# Patient Record
Sex: Female | Born: 1965 | Race: White | Hispanic: No | Marital: Single | Smoking: Current every day smoker
Health system: Southern US, Community
[De-identification: ages and names within clinical notes are randomized; demographics above are authoritative.]

## PROBLEM LIST (undated history)

## (undated) HISTORY — PX: ABDOMINAL HYSTERECTOMY: SHX81

---

## 2017-12-20 ENCOUNTER — Other Ambulatory Visit: Payer: Self-pay

## 2017-12-20 ENCOUNTER — Encounter (HOSPITAL_BASED_OUTPATIENT_CLINIC_OR_DEPARTMENT_OTHER): Payer: Self-pay | Admitting: *Deleted

## 2017-12-20 ENCOUNTER — Emergency Department (HOSPITAL_BASED_OUTPATIENT_CLINIC_OR_DEPARTMENT_OTHER)
Admission: EM | Admit: 2017-12-20 | Discharge: 2017-12-20 | Disposition: A | Discharge status: Home / Self Care | Payer: PRIVATE HEALTH INSURANCE | Attending: Emergency Medicine | Admitting: Emergency Medicine

## 2017-12-20 ENCOUNTER — Emergency Department (HOSPITAL_BASED_OUTPATIENT_CLINIC_OR_DEPARTMENT_OTHER): Payer: PRIVATE HEALTH INSURANCE

## 2017-12-20 DIAGNOSIS — Y929 Unspecified place or not applicable: Secondary | ICD-10-CM | POA: Insufficient documentation

## 2017-12-20 DIAGNOSIS — Y998 Other external cause status: Secondary | ICD-10-CM | POA: Insufficient documentation

## 2017-12-20 DIAGNOSIS — S8001XA Contusion of right knee, initial encounter: Secondary | ICD-10-CM | POA: Diagnosis not present

## 2017-12-20 DIAGNOSIS — F17200 Nicotine dependence, unspecified, uncomplicated: Secondary | ICD-10-CM | POA: Diagnosis not present

## 2017-12-20 DIAGNOSIS — W010XXA Fall on same level from slipping, tripping and stumbling without subsequent striking against object, initial encounter: Secondary | ICD-10-CM | POA: Diagnosis not present

## 2017-12-20 DIAGNOSIS — Y9301 Activity, walking, marching and hiking: Secondary | ICD-10-CM | POA: Insufficient documentation

## 2017-12-20 DIAGNOSIS — W19XXXA Unspecified fall, initial encounter: Secondary | ICD-10-CM

## 2017-12-20 DIAGNOSIS — R6884 Jaw pain: Secondary | ICD-10-CM | POA: Diagnosis not present

## 2017-12-20 DIAGNOSIS — S8991XA Unspecified injury of right lower leg, initial encounter: Secondary | ICD-10-CM | POA: Diagnosis present

## 2017-12-20 NOTE — ED Provider Notes (Signed)
[facility] EMERGENCY DEPARTMENT Provider Note   CSN: [id number] Arrival date & time: 12/20/17  1712     History   Chief Complaint Chief Complaint  Patient presents with  . Fall    HPI Samantha Hull is a 52 y.o. female.  She is who presents the emergency department for evaluation of knee pain.  Patient states that she was in [place] for work today and she came down off of a step and fell directly onto her right knee and hit her right jaw.  She denies loss of consciousness, difficulty with bites.  She complains of pain and difficulty ambulating on the right is especially with trying to extend the knee fully.  She has been able to ambulate however.  She was able to drive home from [place].  She denies numbness, tingling, calf pain, chest pain shortness of breath.  HPI  History reviewed. No pertinent past medical history.  There are no active problems to display for this patient.   Past Surgical History:  Procedure Laterality Date  . ABDOMINAL HYSTERECTOMY       OB History   None      Home Medications    Prior to Admission medications   Not on File    Family History History reviewed. No pertinent family history.  Social History Social History   Tobacco Use  . Smoking status: Current Every Day Smoker  . Smokeless tobacco: Never Used  Substance Use Topics  . Alcohol use: Yes    Frequency: Never  . Drug use: Never     Allergies   Patient has no known allergies.   Review of Systems Review of Systems Ten systems reviewed and are negative for acute change, except as noted in the HPI.    Physical Exam Updated Vital Signs BP (!) 143/87 (BP Location: Left Arm)   Pulse 94   Temp 98.7 F (37.1 C) (Oral)   Resp 18   Ht 5\' 6"  (1.676 m)   Wt 58.5 kg   SpO2 100%   BMI 20.82 kg/m   Physical Exam  Constitutional: She is oriented to person, place, and time. She appears well-developed and well-nourished. No distress.  HENT:  Head:  Normocephalic.  Small bruise on the left lower mandible.  Strong bite, teeth intact.  Eyes: Conjunctivae are normal. No scleral icterus.  Neck: Normal range of motion.  Cardiovascular: Normal rate, regular rhythm and normal heart sounds. Exam reveals no gallop and no friction rub.  No murmur heard. Pulmonary/Chest: Effort normal and breath sounds normal. No respiratory distress.  Abdominal: Soft. Bowel sounds are normal. She exhibits no distension and no mass. There is no tenderness. There is no guarding.  Musculoskeletal:  Right knee with obvious hematoma over the patella.  Tender to palpation.  Mild abrasion.  Patient has a pain with hyperflexion and full extension of the knee.  She has normal active range of motion.  Ligaments appear stable.  Contralateral knee exam is normal.  Ipsilateral ankle and hip exam are normal.  Neurological: She is alert and oriented to person, place, and time.  Skin: Skin is warm and dry. She is not diaphoretic.  Psychiatric: Her behavior is normal.  Nursing note and vitals reviewed.    ED Treatments / Results  Labs (all labs ordered are listed, but only abnormal results are displayed) Labs Reviewed - No data to display  EKG None  Radiology Dg Knee Complete 4 Views Left  Result Date: 12/20/2017 CLINICAL DATA:  Status post fall today with injury to both knees. EXAM: LEFT KNEE - COMPLETE 4+ VIEW COMPARISON:  None. FINDINGS: No evidence of fracture, dislocation, or joint effusion. Minimal narrowing of the medial femoral tibial space is noted. Soft tissues are unremarkable. IMPRESSION: No acute fracture or dislocation. Electronically Signed   By: Sherian Rein M.D.   On: 12/20/2017 18:25   Dg Knee Complete 4 Views Right  Result Date: 12/20/2017 CLINICAL DATA:  Status post fall today with injury to both knees. EXAM: RIGHT KNEE - COMPLETE 4+ VIEW COMPARISON:  None. FINDINGS: No evidence of fracture, dislocation, or joint effusion. No evidence of arthropathy or  other focal bone abnormality. Soft tissues are unremarkable. IMPRESSION: Negative. Electronically Signed   By: Sherian Rein M.D.   On: 12/20/2017 18:24    Procedures Procedures (including critical care time)  Medications Ordered in ED Medications - No data to display   Initial Impression / Assessment and Plan / ED Course  I have reviewed the triage vital signs and the nursing notes.  Pertinent labs & imaging results that were available during my care of the patient were reviewed by me and considered in my medical decision making (see chart for details).     Patient with fall, traumatic hematoma of the left knee.  I reviewed the patient's x-rays of both knees and see no significant abnormalities, fractures, effusions or obvious dislocations.  They agree with the radiologic interpretation.  Patient will be given crutches ice and anti-inflammatories.  I do not feel that a knee sleeve would be helpful due to the pressure over the hematoma patient is ambulatory.  She appears appropriate for discharge with supportive care    Final Clinical Impressions(s) / ED Diagnoses   Final diagnoses:  None    ED Discharge Orders    None       Arthor Captain, PA-C 12/20/17 2034    Maia Plan, MD 12/21/17 1141

## 2017-12-20 NOTE — Discharge Instructions (Addendum)
Contact a health care provider if: Your symptoms do not improve after several days of treatment. Your symptoms get worse. You have difficulty moving the injured area. Get help right away if: You have severe pain. You have numbness in a hand or foot. Your hand or foot turns pale or cold. 

## 2017-12-20 NOTE — ED Triage Notes (Signed)
While in [place] for work today she was walking to her car, got weak, her legs gave out and she fell. Injury to both knees. She was able to drive back to [place] afterward.

## 2019-08-27 IMAGING — CR DG KNEE COMPLETE 4+V*L*
4 series · 4 of 4 positions shown · non-contrast
Comparison: None.

CLINICAL DATA: Status post fall today with injury to both knees.

EXAM:
LEFT KNEE - COMPLETE 4+ VIEW

[t knee ap left]
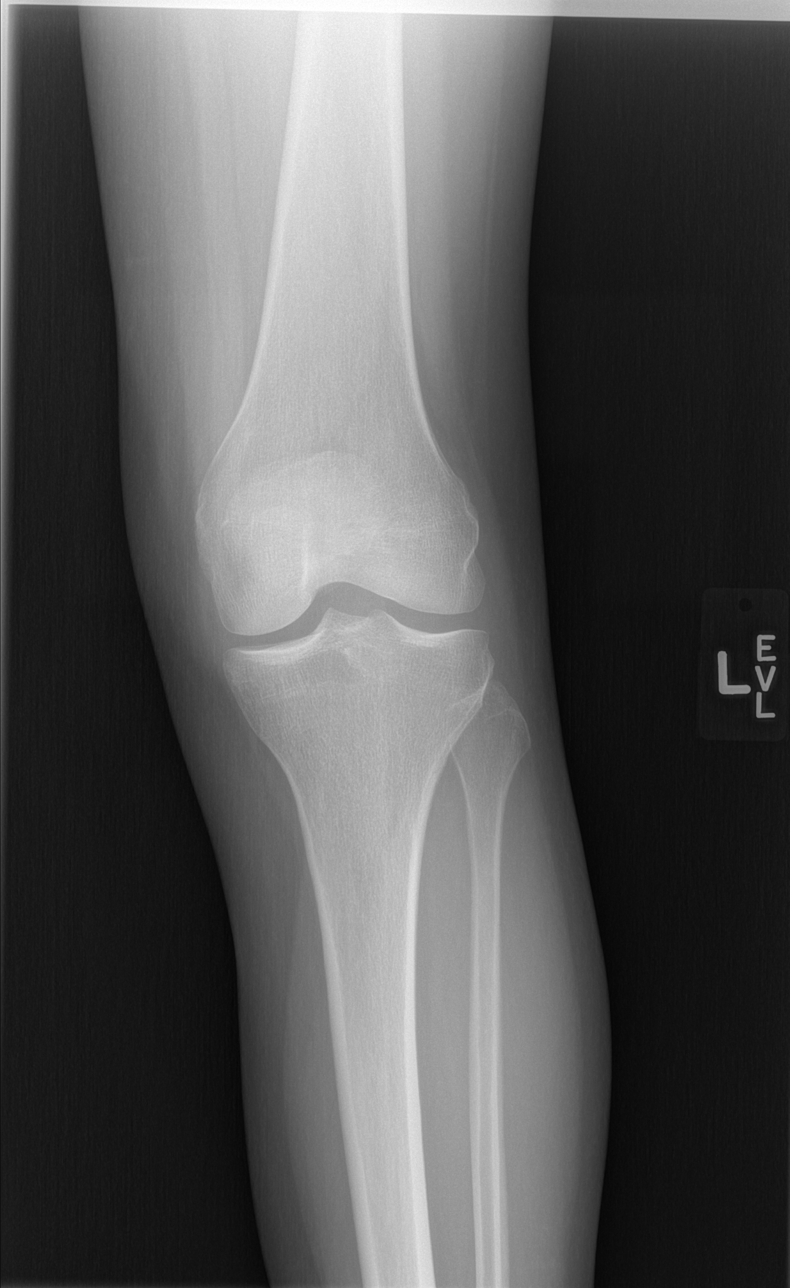

[t knee oblique left (1 of 2)]
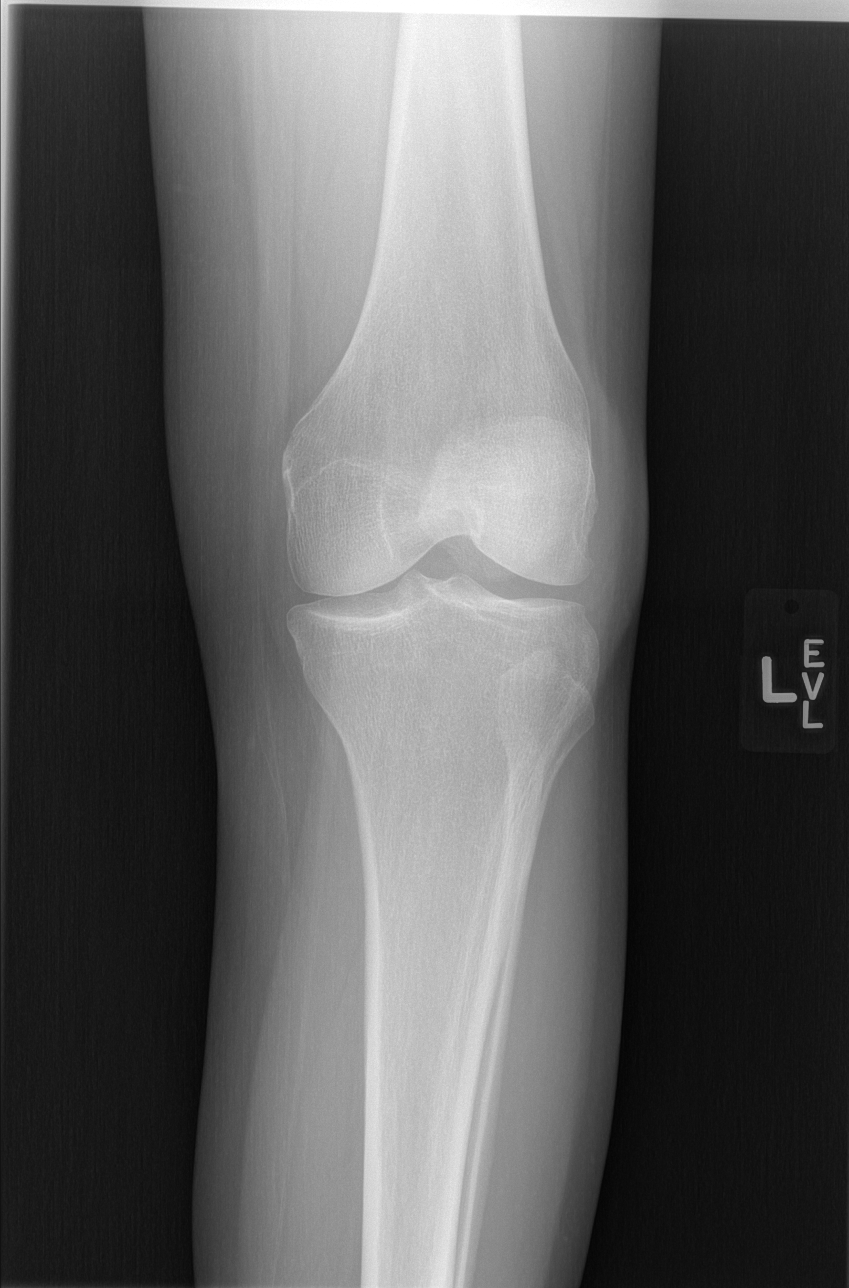

[t knee oblique left (2 of 2)]
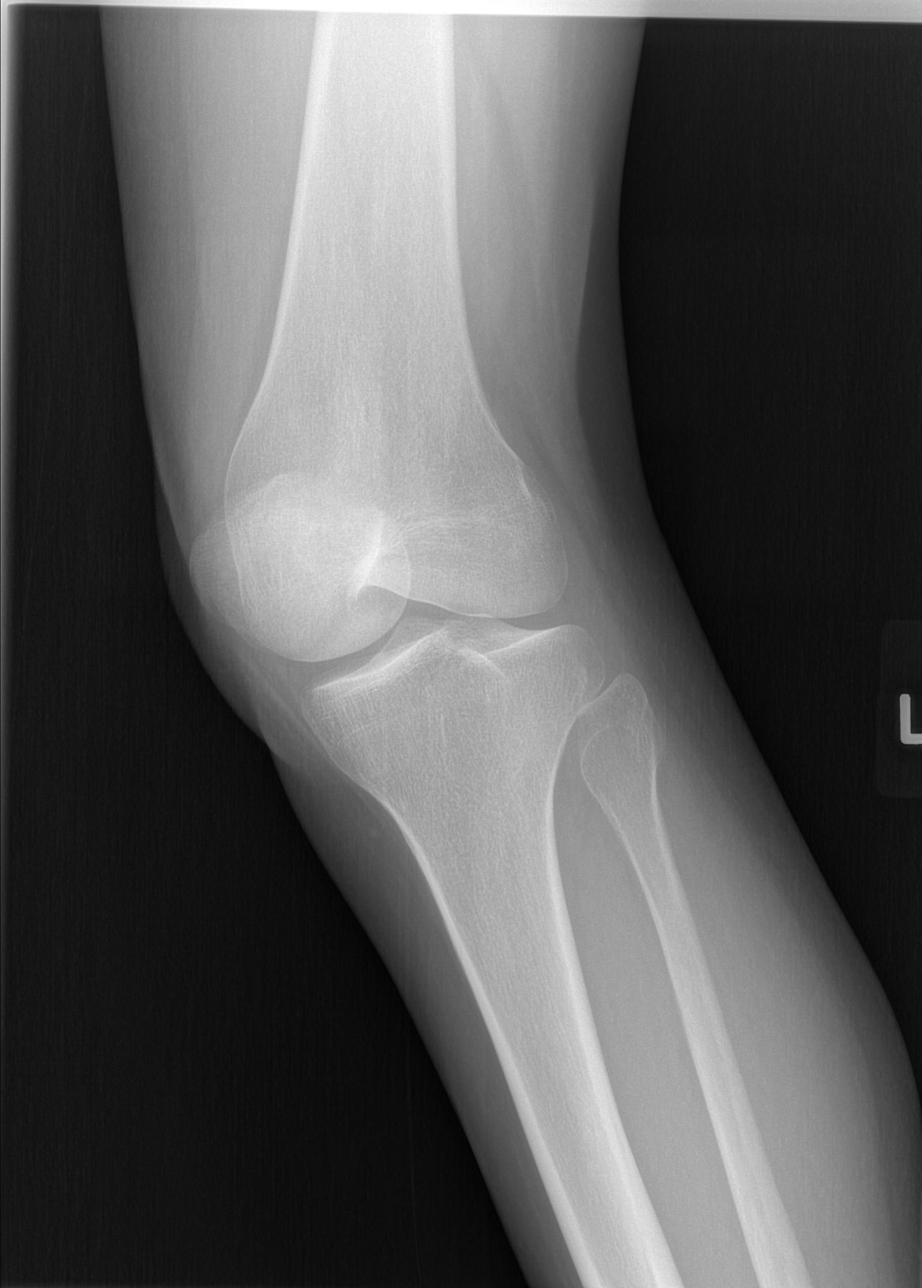

[t knee lat left]
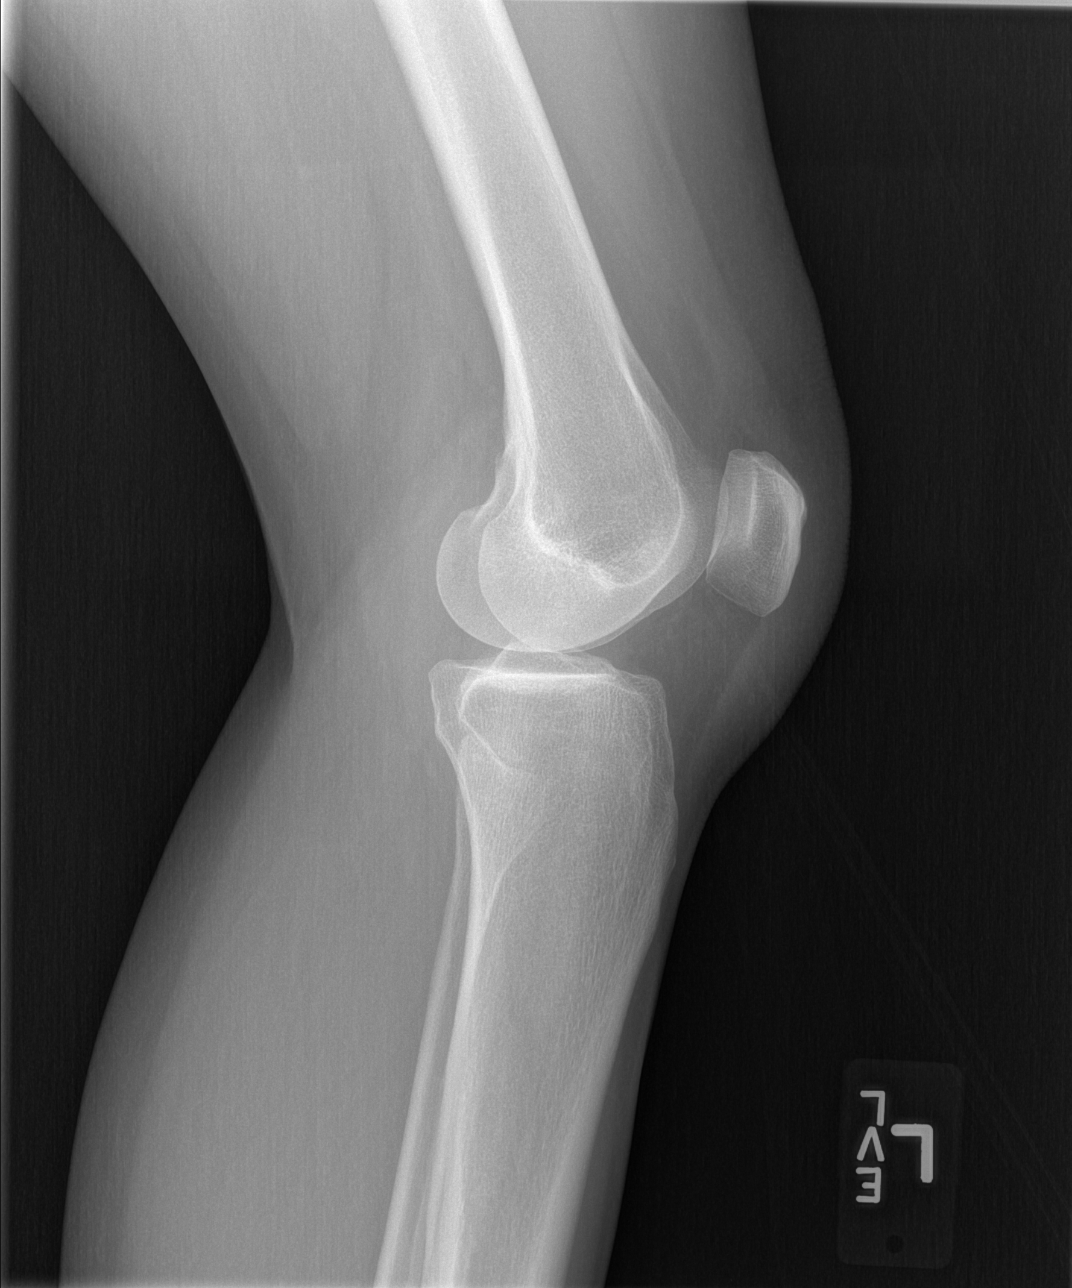

[4 of 4 positions shown; findings below may reference images not displayed]

FINDINGS: No evidence of fracture, dislocation, or joint effusion.. Minimal
narrowing of the medial femoral tibial space is noted. Soft tissues
are unremarkable.
IMPRESSION: No acute fracture or dislocation.

## 2019-08-27 IMAGING — CR DG KNEE COMPLETE 4+V*R*
4 series · 4 of 4 positions shown · non-contrast
Comparison: None.

CLINICAL DATA: Status post fall today with injury to both knees.

EXAM:
RIGHT KNEE - COMPLETE 4+ VIEW

[t knee ap right]
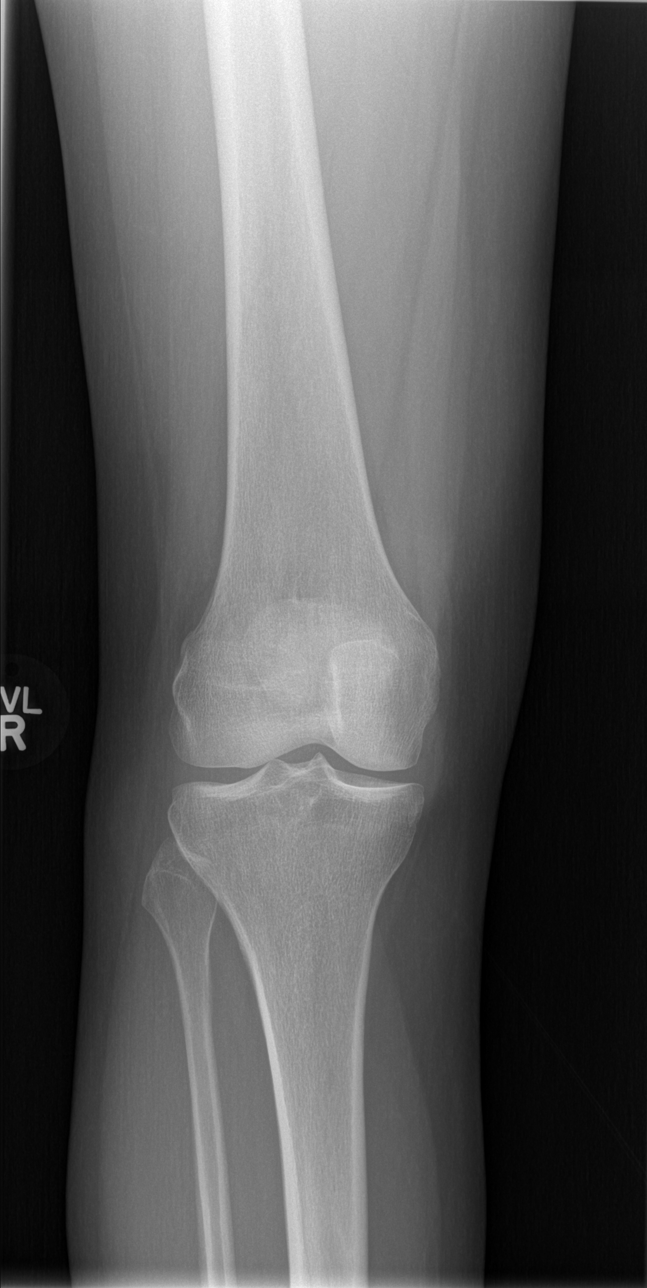

[t knee oblique right (1 of 2)]
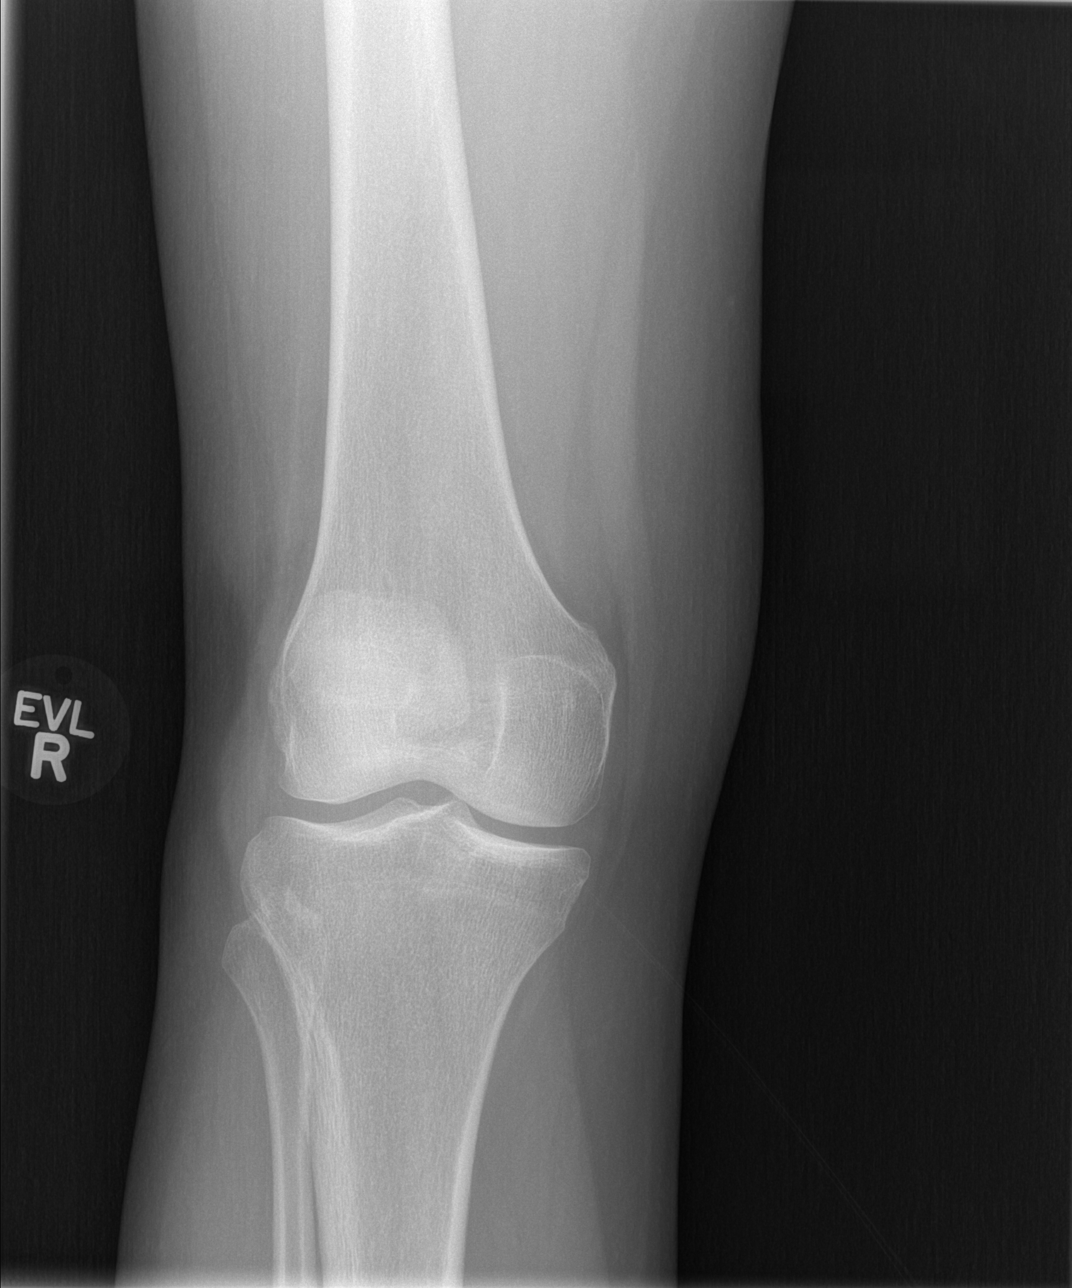

[t knee oblique right (2 of 2)]
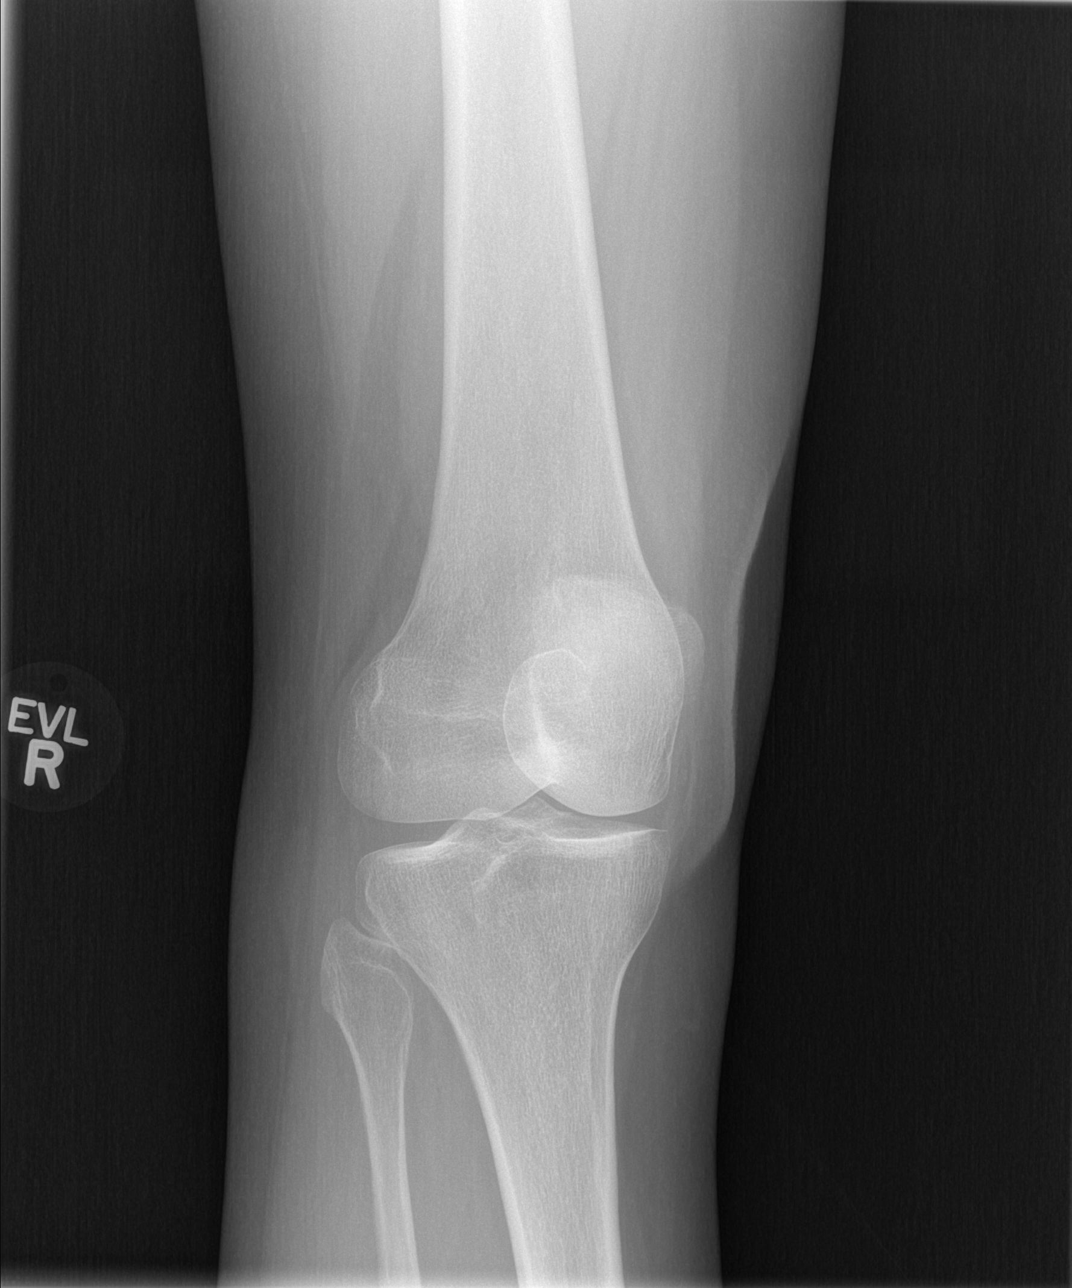

[t knee lat right]
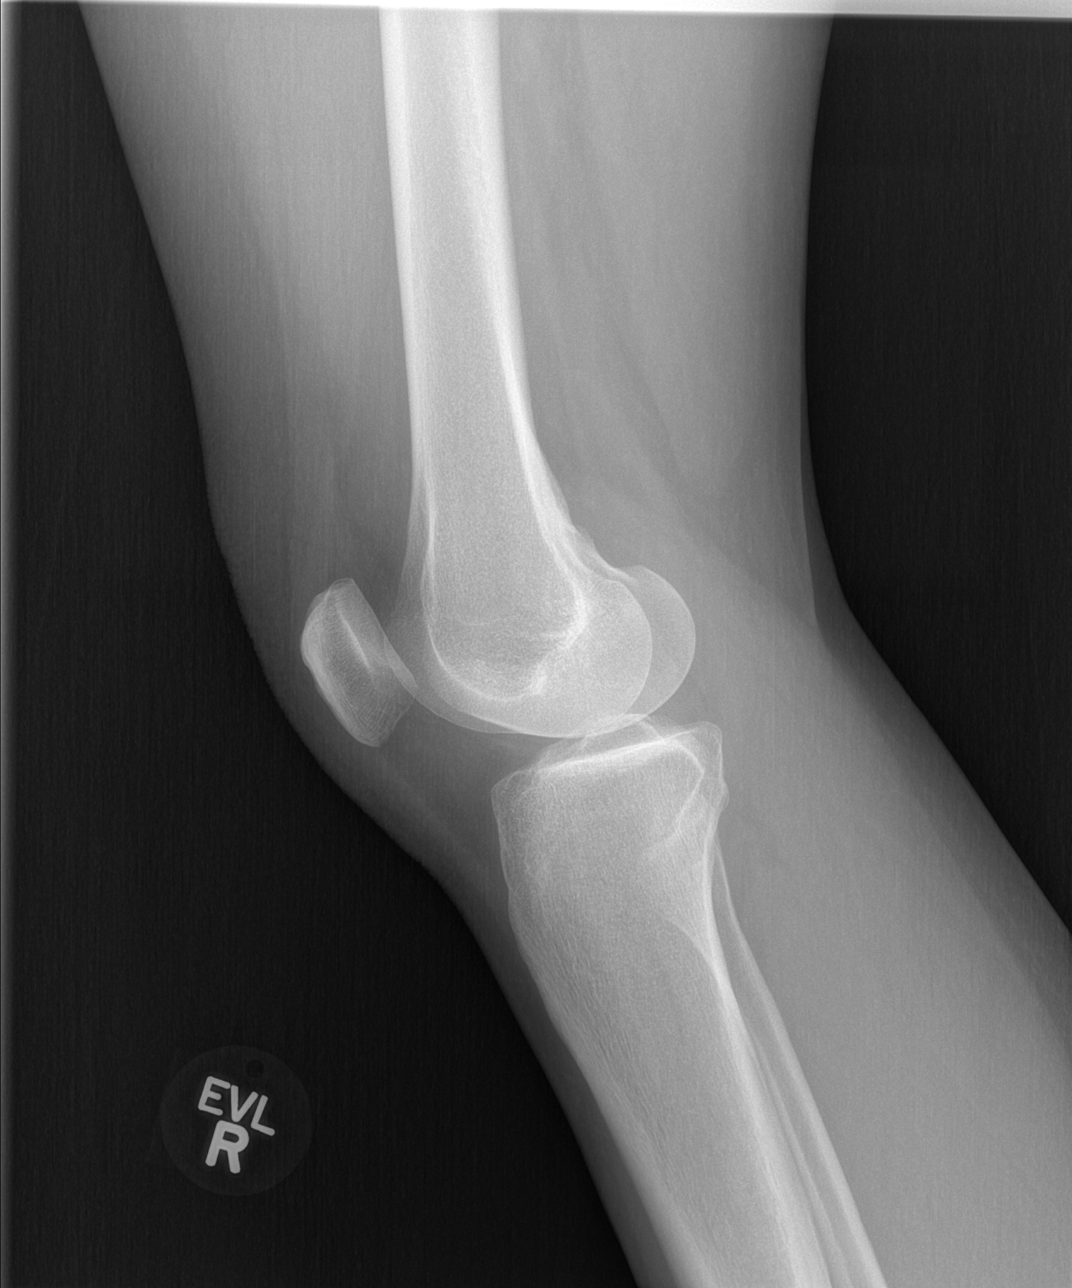

[4 of 4 positions shown; findings below may reference images not displayed]

FINDINGS: No evidence of fracture, dislocation, or joint effusion. No evidence
of arthropathy or other focal bone abnormality. Soft tissues are
unremarkable.
IMPRESSION: Negative.
